# Patient Record
Sex: Male | Born: 1990 | Race: Black or African American | Hispanic: No | Marital: Single | Smoking: Former smoker
Health system: Southern US, Community
[De-identification: ages and names within clinical notes are randomized; demographics above are authoritative.]

## PROBLEM LIST (undated history)

## (undated) HISTORY — PX: SHOULDER SURGERY: SHX246

---
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
## 2010-01-23 ENCOUNTER — Encounter: Payer: Self-pay | Admitting: Sports Medicine

## 2010-05-17 NOTE — Op Note (Signed)
NAMEOLUWAFEMI, VILLELLA               ACCOUNT NO.:  [id number]   MEDICAL RECORD NO.:  [id number]          PATIENT TYPE:  AMB   LOCATION:  DSC                          FACILITY:  [facility]   PHYSICIAN:  Loreta Ave, M.D. 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   PROCEDURE:  Left shoulder exam under anesthesia, arthroscopy,  debridement of Hill-Sachs lesion, and labral tear.  Arthroscopic-  assisted Bankart reconstruction utilizing FiberLink suture x3 and  PushLock anchors x3.   SURGEON:  Loreta Ave, MD   ASSISTANT:  Genene Churn. Barry Dienes, [place], present throughout the entire case and  necessary for timely completion of procedure.   ANESTHESIA:  General.   BLOOD LOSS:  Minimal.   SPECIMENS:  None.   COMPLICATIONS:  None.   DRESSING:  Soft compressive with shoulder immobilizer.   PROCEDURE:  The patient brought to the operating room and placed on  operating table in supine position.  After adequate anesthesia had been  obtained, shoulder examined.  Anterior instability confirmed.  Stable  posteriorly.  Placed in beach-chair position on the shoulder positioner,  prepped and draped by usual sterile fashion.  Two portals anterior and  posterior.  Shoulder entered with blunt obturator.  Arthroscope  introduced, shoulder distended and inspected.  A complex tearing of the  labrum from 12 to 6 o'clock debrided.  A little roughening on the front  of the glenoid.  A very generous Bankart lesion going from the root of  the biceps above almost all the way down to 6 o'clock.  Reasonable  capsular  ligamentous tissue for repair.  Front of the glenoid roughened.  Rotator cuff, biceps tendon otherwise intact.  About a centimeter and  half of Hill-Sachs lesion back of the humeral head with some chondral  debris cleaned up.  The anterior portal was then opened and a cannula  placed.  Utilizing a lasso device, the capsular labral interface was  captured at the 1, 3, and 5 o' clock positions.  This was advanced and  then firmly anchored down into the glenoid with 3 PushLock anchors which  were predrilled.  The top of which react to the front of the biceps.  Once I confirmed a nice firm, solid repair, the entire shoulder examined  and no other findings appreciated.  I did look subacromially from the  back portal.  A little bursitis cleared out.  No significant  impingement as rotator cuff looked good.  Instruments and fluid removed.  Portals were injected with Marcaine and closed with nylon.  Sterile  compressive dressing applied.  Shoulder immobilizer applied.  Anesthesia  reversed.  Brought to the recovery room.  Tolerated the surgery well.  No complications.       Loreta Ave, M.D.  Electronically  Signed     DFM/MEDQ  D:  05/21/2008  T:  05/22/2008  Job:  [id number]

## 2010-11-09 ENCOUNTER — Emergency Department (HOSPITAL_COMMUNITY): Payer: Self-pay

## 2010-11-09 ENCOUNTER — Encounter: Payer: Self-pay | Admitting: *Deleted

## 2010-11-09 ENCOUNTER — Emergency Department (HOSPITAL_COMMUNITY)
Admission: EM | Admit: 2010-11-09 | Discharge: 2010-11-09 | Disposition: A | Discharge status: Home / Self Care | Payer: Self-pay | Attending: Emergency Medicine | Admitting: Emergency Medicine

## 2010-11-09 DIAGNOSIS — R413 Other amnesia: Secondary | ICD-10-CM | POA: Insufficient documentation

## 2010-11-09 DIAGNOSIS — S0990XA Unspecified injury of head, initial encounter: Secondary | ICD-10-CM

## 2010-11-09 DIAGNOSIS — F17200 Nicotine dependence, unspecified, uncomplicated: Secondary | ICD-10-CM | POA: Insufficient documentation

## 2010-11-09 DIAGNOSIS — W1809XA Striking against other object with subsequent fall, initial encounter: Secondary | ICD-10-CM | POA: Insufficient documentation

## 2010-11-09 DIAGNOSIS — S060X9A Concussion with loss of consciousness of unspecified duration, initial encounter: Secondary | ICD-10-CM | POA: Insufficient documentation

## 2010-11-09 DIAGNOSIS — R51 Headache: Secondary | ICD-10-CM | POA: Insufficient documentation

## 2010-11-09 DIAGNOSIS — Y92009 Unspecified place in unspecified non-institutional (private) residence as the place of occurrence of the external cause: Secondary | ICD-10-CM | POA: Insufficient documentation

## 2010-11-09 LAB — GLUCOSE, CAPILLARY: Glucose-Capillary: 97 mg/dL (ref 70–99)

## 2010-11-09 MED ORDER — ACETAMINOPHEN 325 MG PO TABS
ORAL_TABLET | ORAL | Status: AC
  Filled 2010-11-09: qty 2

## 2010-11-09 MED ORDER — ACETAMINOPHEN 325 MG PO TABS: 650.0000 mg | ORAL_TABLET | Freq: Once | ORAL | Status: DC
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
## 2010-11-09 NOTE — ED Notes (Signed)
2 325 mg tablets PO given to pt at this time.

## 2012-02-28 ENCOUNTER — Emergency Department (HOSPITAL_BASED_OUTPATIENT_CLINIC_OR_DEPARTMENT_OTHER): Payer: BC Managed Care – PPO

## 2012-02-28 ENCOUNTER — Encounter (HOSPITAL_BASED_OUTPATIENT_CLINIC_OR_DEPARTMENT_OTHER): Payer: Self-pay | Admitting: *Deleted

## 2012-02-28 ENCOUNTER — Emergency Department (HOSPITAL_BASED_OUTPATIENT_CLINIC_OR_DEPARTMENT_OTHER)
Admission: EM | Admit: 2012-02-28 | Discharge: 2012-02-28 | Disposition: A | Discharge status: Home / Self Care | Payer: BC Managed Care – PPO | Attending: Emergency Medicine | Admitting: Emergency Medicine

## 2012-02-28 DIAGNOSIS — R000 Tachycardia, unspecified: Secondary | ICD-10-CM | POA: Insufficient documentation

## 2012-02-28 DIAGNOSIS — T50905A Adverse effect of unspecified drugs, medicaments and biological substances, initial encounter: Secondary | ICD-10-CM

## 2012-02-28 DIAGNOSIS — F17200 Nicotine dependence, unspecified, uncomplicated: Secondary | ICD-10-CM | POA: Insufficient documentation

## 2012-02-28 DIAGNOSIS — R002 Palpitations: Secondary | ICD-10-CM | POA: Insufficient documentation

## 2012-02-28 DIAGNOSIS — F1210 Cannabis abuse, uncomplicated: Secondary | ICD-10-CM | POA: Insufficient documentation

## 2012-02-28 DIAGNOSIS — T40905A Adverse effect of unspecified psychodysleptics [hallucinogens], initial encounter: Secondary | ICD-10-CM | POA: Insufficient documentation

## 2012-02-28 LAB — BASIC METABOLIC PANEL
BUN: 17 mg/dL (ref 6–23)
CO2: 27 mEq/L (ref 19–32)
Chloride: 103 mEq/L (ref 96–112)
Creatinine, Ser: 1.1 mg/dL (ref 0.50–1.35)
GFR calc Af Amer: >90 mL/min (ref >90)

## 2012-02-28 LAB — RAPID URINE DRUG SCREEN, HOSP PERFORMED
Amphetamines: NOT DETECTED
Tetrahydrocannabinol: POSITIVE — AB

## 2012-02-28 LAB — CBC WITH DIFFERENTIAL/PLATELET
Basophils Relative: 0 % (ref 0–1)
HCT: 39.3 % (ref 39.0–52.0)
Hemoglobin: 13.5 g/dL (ref 13.0–17.0)
MCHC: 34.4 g/dL (ref 30.0–36.0)
MCV: 81.9 fL (ref 78.0–100.0)
Monocytes Absolute: 0.6 10*3/uL (ref 0.1–1.0)
Monocytes Relative: 8 % (ref 3–12)
Neutro Abs: 3.5 10*3/uL (ref 1.7–7.7)

## 2012-02-28 MED ORDER — LORAZEPAM 2 MG/ML IJ SOLN: 1.0000 mg | Freq: Once | INTRAMUSCULAR | Status: DC

## 2012-02-28 MED ORDER — LORAZEPAM 2 MG/ML IJ SOLN
1.0000 mg | Freq: Once | INTRAMUSCULAR | Status: AC
  Administered 2012-02-28: 1 mg via INTRAVENOUS
  Filled 2012-02-28: qty 1

## 2012-02-28 MED ORDER — SODIUM CHLORIDE 0.9 % IV BOLUS (SEPSIS): 500.0000 mL | Freq: Once | INTRAVENOUS | Status: DC

## 2012-02-28 MED ORDER — SODIUM CHLORIDE 0.9 % IV BOLUS (SEPSIS)
2000.0000 mL | Freq: Once | INTRAVENOUS | Status: AC
  Administered 2012-02-28: 2000 mL via INTRAVENOUS

## 2012-02-28 NOTE — ED Notes (Signed)
Radiology at bedside

## 2012-02-28 NOTE — ED Notes (Signed)
Second 0.9% NS bolus bag hung at this time.
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
## 2012-02-28 NOTE — ED Provider Notes (Addendum)
History     CSN: [id number]  Arrival date & time 02/28/12  0246   First MD Initiated Contact with Patient 02/28/12 0301      Chief Complaint  Patient presents with  . Chest Pain    (Consider location/radiation/quality/duration/timing/severity/associated sxs/prior treatment) Patient is a 22 y.o. male presenting with palpitations. The history is provided by the patient.  Palpitations  This is a new problem. The current episode started 1 to 2 hours ago. The problem occurs constantly. The problem has not changed since onset.Associated with: smoked marijuana. Pertinent negatives include no diaphoresis, no fever, no cough and no shortness of breath. He has tried nothing for the symptoms. The treatment provided no relief. Risk factors include being male. His past medical history does not include heart disease.    History reviewed. No pertinent past medical history.  History reviewed. No pertinent past surgical history.  No family history on file.  History  Substance Use Topics  . Smoking status: Current Every Day Smoker  . Smokeless tobacco: Not on file  . Alcohol Use: No      Review of Systems  Constitutional: Negative for fever and diaphoresis.  Respiratory: Negative for cough and shortness of breath.   Cardiovascular: Positive for palpitations. Negative for leg swelling.  All other systems reviewed and are negative.    Allergies  Review of patient's allergies indicates no known allergies.  Home Medications  No current outpatient prescriptions on file.  BP 158/98  Pulse 156  Temp(Src) 99.5 F (37.5 C) (Oral)  Resp 18  Wt 170 lb (77.111 kg)  SpO2 98%  Physical Exam  Constitutional: He is oriented to person, place, and time. He appears well-developed and well-nourished. No distress.  HENT:  Head: Normocephalic and atraumatic.  Mouth/Throat: Oropharynx is clear and moist.  Eyes: Conjunctivae are normal. Pupils are equal, round, and reactive to light.  Neck:  Normal range of motion. Neck supple.  Cardiovascular: Regular rhythm.  Tachycardia present.   Pulmonary/Chest: Effort normal and breath sounds normal. He has no wheezes. He has no rales.  Abdominal: Soft. Bowel sounds are normal. There is no tenderness. There is no rebound and no guarding.  Musculoskeletal: Normal range of motion.  Neurological: He is alert and oriented to person, place, and time.  Skin: Skin is warm and dry.  Psychiatric: He has a normal mood and affect.    ED Course  Procedures (including critical care time)  Labs Reviewed  CBC WITH DIFFERENTIAL  BASIC METABOLIC PANEL  URINE RAPID DRUG SCREEN (HOSP PERFORMED)   No results found.   No diagnosis found.   Cardiac etiology very unlikely Wells score 1.5, PE extremely unlikely.  No surgery no dvt, no travel.  Sx start immediately with marijuana.  No indication for advance imaging at this time MDM  Suspect marijuana laced with bath salts causing this reaction in the patient.  Patient denied chest pain but had palpitations.  Symptoms have resolved with ativan and 2 L NSS.  Sleeping comfortably in the room.  Stable for discharge at this time.  Patient advised to stop and not to use marijuana again.  Patient verbalized understanding of these instructions     Date: 02/28/2012  Rate: 146  Rhythm: sinus tachycardia  QRS Axis: normal  Intervals: normal  ST/T Wave abnormalities: normal  Conduction Disutrbances: none  Narrative Interpretation: sinus tachycardia        Jersee Winiarski K Alegandro Macnaughton-Rasch, MD 02/28/12 0441  Munachimso Rigdon K Kristeen Lantz-Rasch, MD 02/28/12 [phone]

## 2012-02-28 NOTE — ED Notes (Signed)
MD at bedside. 

## 2012-03-06 ENCOUNTER — Emergency Department (HOSPITAL_BASED_OUTPATIENT_CLINIC_OR_DEPARTMENT_OTHER)
Admission: EM | Admit: 2012-03-06 | Discharge: 2012-03-06 | Disposition: A | Discharge status: Home / Self Care | Payer: BC Managed Care – PPO | Attending: Emergency Medicine | Admitting: Emergency Medicine

## 2012-03-06 ENCOUNTER — Encounter (HOSPITAL_BASED_OUTPATIENT_CLINIC_OR_DEPARTMENT_OTHER): Payer: Self-pay | Admitting: *Deleted

## 2012-03-06 ENCOUNTER — Other Ambulatory Visit: Payer: Self-pay

## 2012-03-06 DIAGNOSIS — R002 Palpitations: Secondary | ICD-10-CM | POA: Insufficient documentation

## 2012-03-06 DIAGNOSIS — T50905A Adverse effect of unspecified drugs, medicaments and biological substances, initial encounter: Secondary | ICD-10-CM

## 2012-03-06 DIAGNOSIS — R109 Unspecified abdominal pain: Secondary | ICD-10-CM | POA: Insufficient documentation

## 2012-03-06 DIAGNOSIS — F17200 Nicotine dependence, unspecified, uncomplicated: Secondary | ICD-10-CM | POA: Insufficient documentation

## 2012-03-06 DIAGNOSIS — R0602 Shortness of breath: Secondary | ICD-10-CM | POA: Insufficient documentation

## 2012-03-06 DIAGNOSIS — T40905A Adverse effect of unspecified psychodysleptics [hallucinogens], initial encounter: Secondary | ICD-10-CM | POA: Insufficient documentation

## 2012-03-06 DIAGNOSIS — E876 Hypokalemia: Secondary | ICD-10-CM | POA: Insufficient documentation

## 2012-03-06 LAB — BASIC METABOLIC PANEL
Calcium: 9.4 mg/dL (ref 8.4–10.5)
GFR calc non Af Amer: >90 mL/min (ref >90)
Sodium: 139 mEq/L (ref 135–145)

## 2012-03-06 LAB — CK: Total CK: 315 U/L — ABNORMAL HIGH (ref 7–232)

## 2012-03-06 MED ORDER — SODIUM CHLORIDE 0.9 % IV BOLUS (SEPSIS)
1000.0000 mL | Freq: Once | INTRAVENOUS | Status: AC
  Administered 2012-03-06: 1000 mL via INTRAVENOUS

## 2012-03-06 MED ORDER — POTASSIUM CHLORIDE CRYS ER 20 MEQ PO TBCR
40.0000 meq | EXTENDED_RELEASE_TABLET | Freq: Once | ORAL | Status: AC
  Administered 2012-03-06: 40 meq via ORAL
  Filled 2012-03-06: qty 2

## 2012-03-06 MED ORDER — POTASSIUM CHLORIDE 20 MEQ/15ML (10%) PO LIQD
20.0000 meq | Freq: Once | ORAL | Status: AC
  Administered 2012-03-06: 20 meq via ORAL
  Filled 2012-03-06: qty 15

## 2012-03-06 MED ORDER — METOPROLOL TARTRATE 1 MG/ML IV SOLN
5.0000 mg | Freq: Once | INTRAVENOUS | Status: AC
  Administered 2012-03-06: 5 mg via INTRAVENOUS
  Filled 2012-03-06: qty 5

## 2012-03-06 MED ORDER — DIAZEPAM 5 MG/ML IJ SOLN: 5.0000 mg | Freq: Once | INTRAMUSCULAR | Status: DC

## 2012-03-06 NOTE — ED Notes (Signed)
Pt. Reports he feels his heart beating real fast and then has trouble taking in a deep breath.  Pt. Report she smokes marajuana and did smoke it tonite.  Pt. Talking non stop in triage and will not sit still.  Pt. Report smoking marajuana 20-30 mins before coming here.

## 2012-03-06 NOTE — ED Provider Notes (Addendum)
History     CSN: [id number]  Arrival date & time 03/06/12  0033   First MD Initiated Contact with Patient 03/06/12 0116      Chief Complaint  Patient presents with  . Palpitations    (Consider location/radiation/quality/duration/timing/severity/associated sxs/prior treatment) HPI This is a 22 year old male who states he has been smoking marijuana for years without incident. A week ago he smoked marijuana after which he developed an adverse reaction. He describes it vaguely as pain and vibrations in his lower abdomen and lower back along with the sensation that he cannot be on properly and cannot take a deep breath. He feels his heart pounding rapidly and feeling short of breath. He is not have associated paresthesias, nausea or vomiting.  He was seen in the ED for this and diagnosed with an adverse reaction to marijuana. He improved with IV fluids and Ativan. He states his symptoms have persisted since then. When he inhales vaporized marijuana (i.e., no burning involved) these symptoms are not exacerbated by when he smokes marijuana, as he did just prior to arrival, it causes the symptoms to be exacerbated, repeating the symptoms for which he was seen a week ago. He describes the symptoms as severe.  History reviewed. No pertinent past medical history.  History reviewed. No pertinent past surgical history.  No family history on file.  History  Substance Use Topics  . Smoking status: Current Every Day Smoker  . Smokeless tobacco: Not on file  . Alcohol Use: No      Review of Systems  All other systems reviewed and are negative.    Allergies  Review of patient's allergies indicates no known allergies.  Home Medications  No current outpatient prescriptions on file.  BP 147/102  Pulse 134  Temp(Src) 98.1 F (36.7 C)  Ht 5\' 9"  (1.753 m)  Wt 170 lb (77.111 kg)  BMI 25.09 kg/m2  SpO2 100%  Physical Exam General: Well-developed, well-nourished male in no acute distress;  appearance consistent with age of record HENT: normocephalic, atraumatic Eyes: pupils equal round and reactive to light; extraocular muscles intact Neck: supple Heart: regular rate and rhythm; tachycardia Lungs: Mild tachypnea; lungs clear to auscultation Abdomen: soft; nondistended; nontender; no masses or hepatosplenomegaly; bowel sounds present; no vibrations or fasciculations palpated Extremities: No deformity; full range of motion Neurologic: Awake, alert and oriented; motor function intact in all extremities and symmetric; no facial droop Skin: Warm and dry Psychiatric: Anxious; circumstantial; pressured speech    ED Course  Procedures (including critical care time)     MDM   Nursing notes and vitals signs, including pulse oximetry, reviewed.  Summary of this visit's results, reviewed by myself:  Labs:  Results for orders placed during the hospital encounter of 03/06/12 (from the past 24 hour(s))  CK     Status: Abnormal   Collection Time    03/06/12  1:55 AM      Result Value Range   Total CK 315 (*) 7 - 232 U/L  BASIC METABOLIC PANEL     Status: Abnormal   Collection Time    03/06/12  1:55 AM      Result Value Range   Sodium 139  135 - 145 mEq/L   Potassium 3.2 (*) 3.5 - 5.1 mEq/L   Chloride 102  96 - 112 mEq/L   CO2 27  19 - 32 mEq/L   Glucose, Bld 109 (*) 70 - 99 mg/dL   BUN 15  6 - 23 mg/dL   Creatinine,  Ser 0.90  0.50 - 1.35 mg/dL   Calcium 9.4  8.4 - 16.1 mg/dL   GFR calc non Af Amer >90  >90 mL/min   GFR calc Af Amer >90  >90 mL/min     EKG Interpretation:  Date & Time: 03/06/2012 12:57 AM  Rate: 115  Rhythm: sinus tachycardia  QRS Axis: normal  Intervals: normal  ST/T Wave abnormalities: normal  Conduction Disutrbances:none  Narrative Interpretation:   Old EKG Reviewed: unchanged  3:08 AM Patient has been sleeping and tachycardia has improved after 5 mg of Lopressor IV. The patient's symptomatology is clearly exacerbated by marijuana. He  states he has been using marijuana since the seventh grade ("for spiritual purposes") and insists these reactions are new. His pressured speech and unusual complaints of his insides "vibrating" suggest a mild psychotic state. We will again advise against future marijuana use.  He was reticent to take potassium chloride given to him to treat his hypokalemia. He stated "I normally don't put things in my body. I don't even take medicines when I'm sick."     Hanley Seamen, MD 03/06/12 0960  Hanley Seamen, MD 03/06/12 4540  Hanley Seamen, MD 03/06/12 9811

## 2012-03-06 NOTE — ED Notes (Signed)
MD at bedside. 

## 2012-03-06 NOTE — ED Notes (Signed)
Pt. Did drive to ED tonight alone.

## 2012-03-06 NOTE — ED Notes (Signed)
Pt reports drove himself here to ED. Does not have anyone to call to pick him up for ride home. Valium not administered. MD made aware.

## 2015-05-31 ENCOUNTER — Emergency Department (HOSPITAL_BASED_OUTPATIENT_CLINIC_OR_DEPARTMENT_OTHER)
Admission: EM | Admit: 2015-05-31 | Discharge: 2015-05-31 | Disposition: A | Discharge status: Home / Self Care | Payer: No Typology Code available for payment source | Attending: Emergency Medicine | Admitting: Emergency Medicine

## 2015-05-31 ENCOUNTER — Encounter (HOSPITAL_BASED_OUTPATIENT_CLINIC_OR_DEPARTMENT_OTHER): Payer: Self-pay

## 2015-05-31 ENCOUNTER — Emergency Department (HOSPITAL_BASED_OUTPATIENT_CLINIC_OR_DEPARTMENT_OTHER): Payer: No Typology Code available for payment source

## 2015-05-31 DIAGNOSIS — Y939 Activity, unspecified: Secondary | ICD-10-CM | POA: Diagnosis not present

## 2015-05-31 DIAGNOSIS — S299XXA Unspecified injury of thorax, initial encounter: Secondary | ICD-10-CM | POA: Diagnosis present

## 2015-05-31 DIAGNOSIS — S39012A Strain of muscle, fascia and tendon of lower back, initial encounter: Secondary | ICD-10-CM | POA: Diagnosis not present

## 2015-05-31 DIAGNOSIS — Z87891 Personal history of nicotine dependence: Secondary | ICD-10-CM | POA: Diagnosis not present

## 2015-05-31 DIAGNOSIS — S161XXA Strain of muscle, fascia and tendon at neck level, initial encounter: Secondary | ICD-10-CM | POA: Diagnosis not present

## 2015-05-31 DIAGNOSIS — Y92410 Unspecified street and highway as the place of occurrence of the external cause: Secondary | ICD-10-CM | POA: Diagnosis not present

## 2015-05-31 DIAGNOSIS — S233XXA Sprain of ligaments of thoracic spine, initial encounter: Secondary | ICD-10-CM | POA: Insufficient documentation

## 2015-05-31 DIAGNOSIS — S29012A Strain of muscle and tendon of back wall of thorax, initial encounter: Secondary | ICD-10-CM | POA: Diagnosis not present

## 2015-05-31 DIAGNOSIS — Y999 Unspecified external cause status: Secondary | ICD-10-CM | POA: Insufficient documentation

## 2015-05-31 MED ORDER — PREDNISONE 10 MG (21) PO TBPK: 10.0000 mg | ORAL_TABLET | Freq: Every day | ORAL | Status: AC

## 2015-05-31 MED ORDER — DEXAMETHASONE SODIUM PHOSPHATE 10 MG/ML IJ SOLN
10.0000 mg | Freq: Once | INTRAMUSCULAR | Status: AC
  Administered 2015-05-31: 10 mg via INTRAMUSCULAR
  Filled 2015-05-31: qty 1

## 2015-05-31 MED ORDER — IBUPROFEN 800 MG PO TABS: 800.0000 mg | ORAL_TABLET | Freq: Three times a day (TID) | ORAL | Status: AC

## 2015-05-31 MED ORDER — HYDROCODONE-ACETAMINOPHEN 5-325 MG PO TABS: 1.0000 | ORAL_TABLET | ORAL | Status: AC | PRN

## 2015-05-31 MED ORDER — KETOROLAC TROMETHAMINE 30 MG/ML IJ SOLN
30.0000 mg | Freq: Once | INTRAMUSCULAR | Status: AC
  Administered 2015-05-31: 30 mg via INTRAMUSCULAR
  Filled 2015-05-31: qty 1
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
